# Patient Record
Sex: Male | Born: 2000 | Race: White | Hispanic: No | Marital: Single | Smoking: Never smoker
Health system: Southern US, Community
[De-identification: ages and names within clinical notes are randomized; demographics above are authoritative.]

## PROBLEM LIST (undated history)

## (undated) DIAGNOSIS — G935 Compression of brain: Secondary | ICD-10-CM

## (undated) DIAGNOSIS — J210 Acute bronchiolitis due to respiratory syncytial virus: Secondary | ICD-10-CM

## (undated) DIAGNOSIS — J189 Pneumonia, unspecified organism: Secondary | ICD-10-CM

## (undated) HISTORY — PX: ADENOIDECTOMY: SUR15

## (undated) HISTORY — PX: TONSILLECTOMY: SUR1361

---
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
## 2023-10-18 IMAGING — DX DG NECK SOFT TISSUE
2 series · 2 of 2 positions shown · non-contrast
Comparison: 09/05/2019

CLINICAL DATA: Blunt trauma to the neck while playing basketball
with throat pain and difficulty swallowing, initial encounter

EXAM:
NECK SOFT TISSUES - 1+ VIEW

[neck lat]
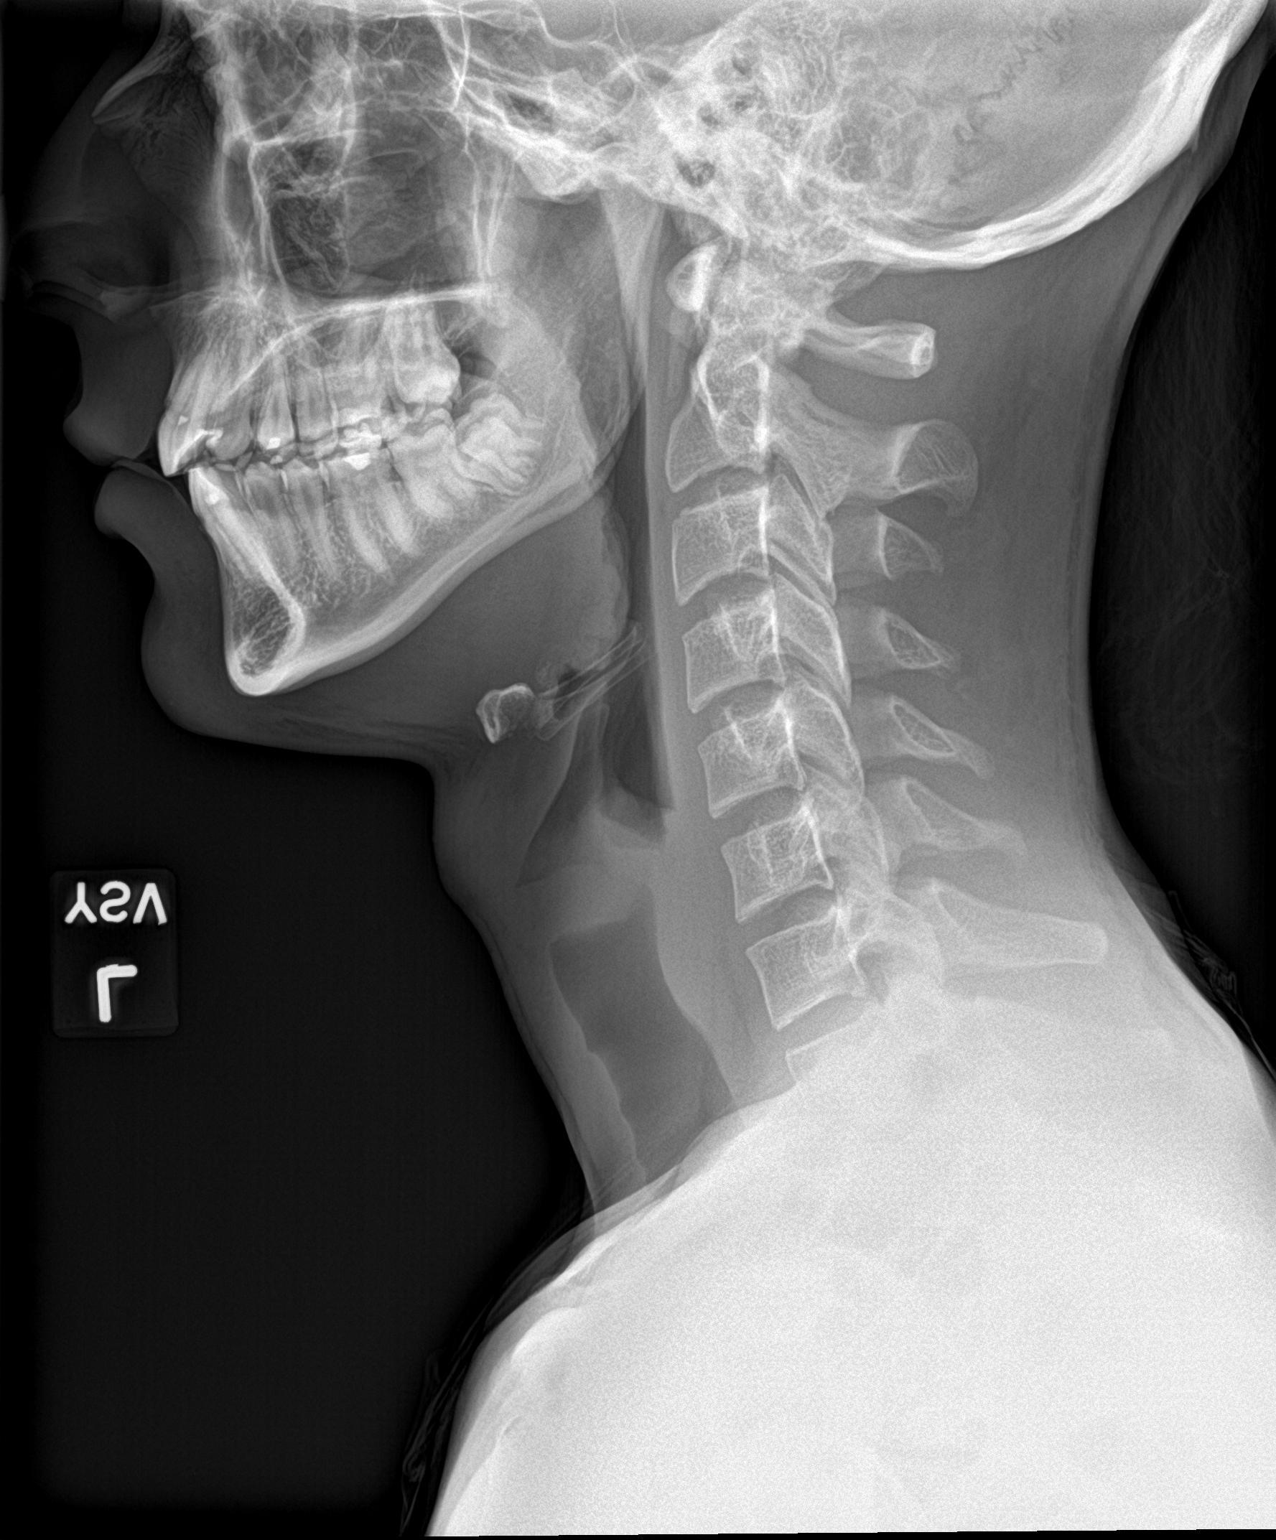

[neck ap]
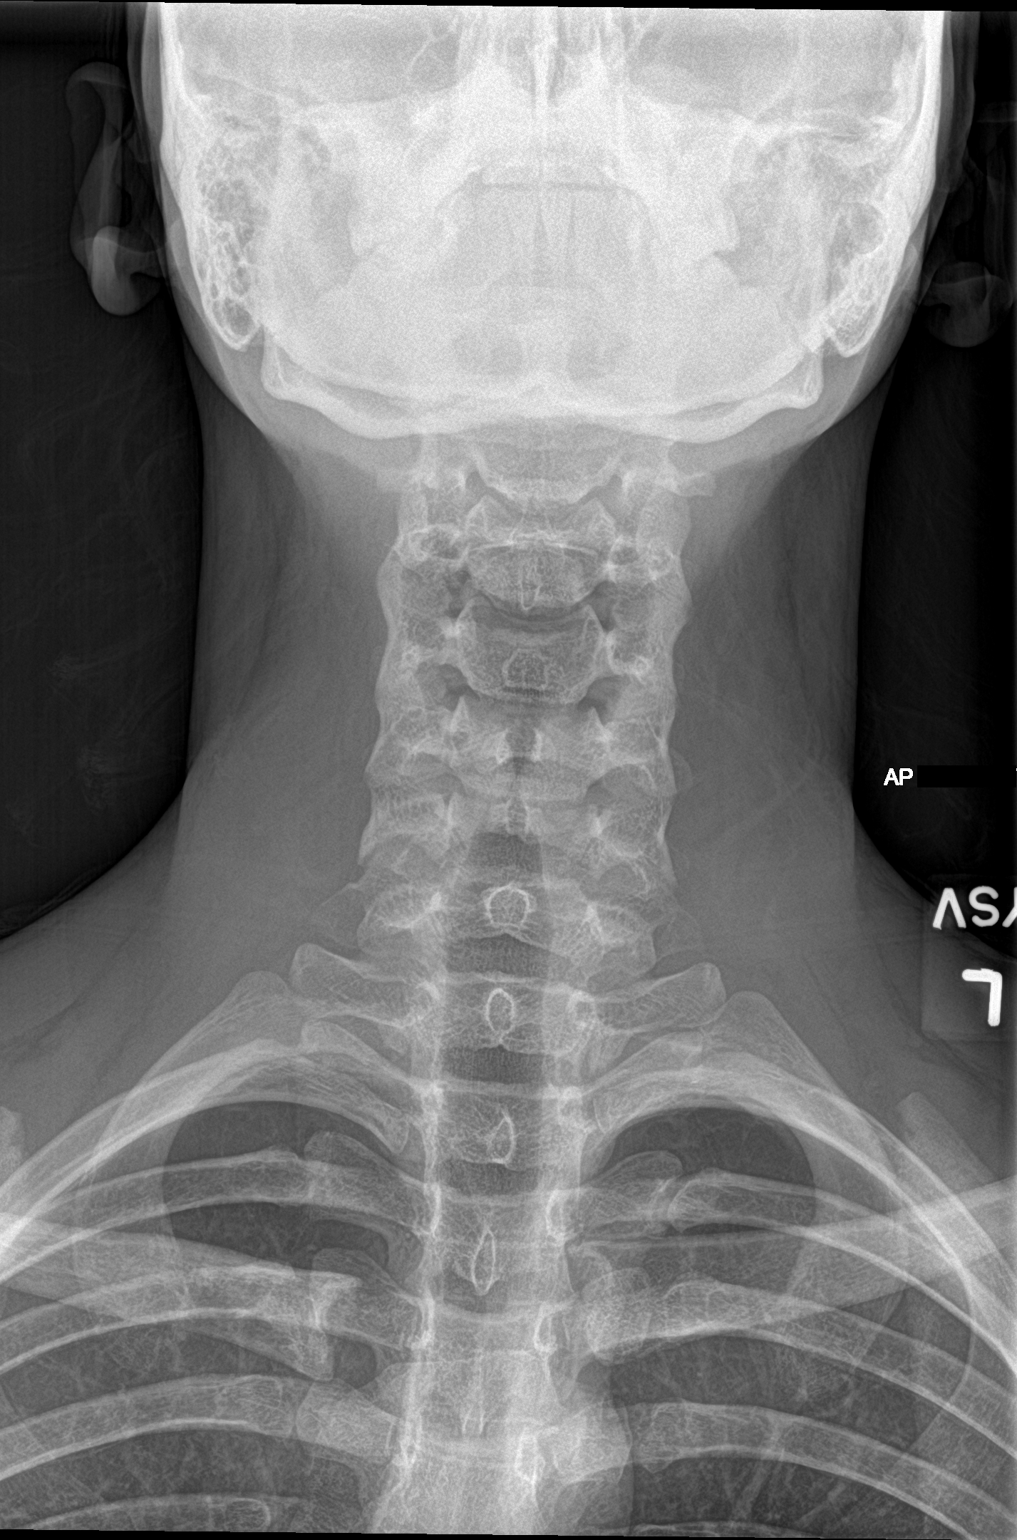

[2 of 2 positions shown; findings below may reference images not displayed]

FINDINGS: No prevertebral soft tissue swelling is noted. No bony abnormality
is seen. Epiglottis and aryepiglottic folds are within normal
limits. No airway abnormality is seen.
IMPRESSION: No acute abnormality noted.
